# Patient Record
Sex: Male | Born: 1940 | Race: White | Hispanic: No | Marital: Married
Health system: Southern US, Community
[De-identification: ages and names within clinical notes are randomized; demographics above are authoritative.]

---

## 2008-07-21 ENCOUNTER — Emergency Department: Payer: Self-pay | Admitting: Emergency Medicine

## 2013-03-21 ENCOUNTER — Encounter: Payer: Self-pay | Admitting: Surgery

## 2013-03-21 ENCOUNTER — Ambulatory Visit: Payer: Self-pay | Admitting: Internal Medicine

## 2013-04-04 ENCOUNTER — Ambulatory Visit: Payer: Self-pay | Admitting: Vascular Surgery

## 2013-04-04 LAB — BASIC METABOLIC PANEL
Anion Gap: 5 — ABNORMAL LOW (ref 7–16)
Co2: 26 mmol/L (ref 21–32)
EGFR (African American): 46 — ABNORMAL LOW
Glucose: 269 mg/dL — ABNORMAL HIGH (ref 65–99)
Osmolality: 279 (ref 275–301)
Sodium: 132 mmol/L — ABNORMAL LOW (ref 136–145)

## 2013-04-12 ENCOUNTER — Inpatient Hospital Stay: Payer: Self-pay | Admitting: Specialist

## 2013-04-12 LAB — URINALYSIS, COMPLETE
Bilirubin,UR: NEGATIVE
Glucose,UR: >=500 mg/dL (ref 0–75)
Ketone: NEGATIVE
LEUKOCYTE ESTERASE: NEGATIVE
Nitrite: NEGATIVE
Ph: 5 (ref 4.5–8.0)
RBC,UR: 4 /HPF (ref 0–5)
Specific Gravity: 1.021 (ref 1.003–1.030)
WBC UR: 3 /HPF (ref 0–5)

## 2013-04-12 LAB — COMPREHENSIVE METABOLIC PANEL
ALK PHOS: 201 U/L — AB
Albumin: 2.4 g/dL — ABNORMAL LOW (ref 3.4–5.0)
Anion Gap: 6 — ABNORMAL LOW (ref 7–16)
BUN: 23 mg/dL — AB (ref 7–18)
Bilirubin,Total: 0.9 mg/dL (ref 0.2–1.0)
CALCIUM: 10.4 mg/dL — AB (ref 8.5–10.1)
CHLORIDE: 98 mmol/L (ref 98–107)
CO2: 26 mmol/L (ref 21–32)
Creatinine: 1.72 mg/dL — ABNORMAL HIGH (ref 0.60–1.30)
EGFR (African American): 46 — ABNORMAL LOW
EGFR (Non-African Amer.): 39 — ABNORMAL LOW
Glucose: 283 mg/dL — ABNORMAL HIGH (ref 65–99)
Osmolality: 275 (ref 275–301)
Potassium: 5.2 mmol/L — ABNORMAL HIGH (ref 3.5–5.1)
SGOT(AST): 71 U/L — ABNORMAL HIGH (ref 15–37)
SGPT (ALT): 75 U/L (ref 12–78)
Sodium: 130 mmol/L — ABNORMAL LOW (ref 136–145)
Total Protein: 8.5 g/dL — ABNORMAL HIGH (ref 6.4–8.2)

## 2013-04-12 LAB — CBC
HCT: 37.9 % — AB (ref 40.0–52.0)
HGB: 12.5 g/dL — ABNORMAL LOW (ref 13.0–18.0)
MCH: 29.4 pg (ref 26.0–34.0)
MCHC: 33.1 g/dL (ref 32.0–36.0)
MCV: 89 fL (ref 80–100)
Platelet: 414 10*3/uL (ref 150–440)
RBC: 4.27 10*6/uL — ABNORMAL LOW (ref 4.40–5.90)
RDW: 13.4 % (ref 11.5–14.5)
WBC: 29.3 10*3/uL — AB (ref 3.8–10.6)

## 2013-04-12 LAB — LIPASE, BLOOD: Lipase: 48 U/L — ABNORMAL LOW (ref 73–393)

## 2013-04-13 LAB — CBC WITH DIFFERENTIAL/PLATELET
BASOS ABS: 0 10*3/uL (ref 0.0–0.1)
Basophil %: 0.2 %
EOS ABS: 0 10*3/uL (ref 0.0–0.7)
Eosinophil %: 0.1 %
HCT: 30.5 % — ABNORMAL LOW (ref 40.0–52.0)
HGB: 10.2 g/dL — ABNORMAL LOW (ref 13.0–18.0)
Lymphocyte #: 1.2 10*3/uL (ref 1.0–3.6)
Lymphocyte %: 5.5 %
MCH: 29.4 pg (ref 26.0–34.0)
MCHC: 33.6 g/dL (ref 32.0–36.0)
MCV: 88 fL (ref 80–100)
Monocyte #: 1.4 x10 3/mm — ABNORMAL HIGH (ref 0.2–1.0)
Monocyte %: 6.3 %
NEUTROS PCT: 87.9 %
Neutrophil #: 19.6 10*3/uL — ABNORMAL HIGH (ref 1.4–6.5)
Platelet: 356 10*3/uL (ref 150–440)
RBC: 3.47 10*6/uL — ABNORMAL LOW (ref 4.40–5.90)
RDW: 13.7 % (ref 11.5–14.5)
WBC: 22.3 10*3/uL — AB (ref 3.8–10.6)

## 2013-04-13 LAB — BASIC METABOLIC PANEL
ANION GAP: 7 (ref 7–16)
BUN: 18 mg/dL (ref 7–18)
CALCIUM: 8.8 mg/dL (ref 8.5–10.1)
CHLORIDE: 104 mmol/L (ref 98–107)
CREATININE: 1.45 mg/dL — AB (ref 0.60–1.30)
Co2: 26 mmol/L (ref 21–32)
EGFR (Non-African Amer.): 48 — ABNORMAL LOW
GFR CALC AF AMER: 56 — AB
Glucose: 192 mg/dL — ABNORMAL HIGH (ref 65–99)
OSMOLALITY: 281 (ref 275–301)
Potassium: 4.2 mmol/L (ref 3.5–5.1)
Sodium: 137 mmol/L (ref 136–145)

## 2013-04-13 LAB — TROPONIN I

## 2013-04-13 LAB — CK: CK, Total: 28 U/L — ABNORMAL LOW (ref 35–232)

## 2013-04-14 LAB — COMPREHENSIVE METABOLIC PANEL
ALK PHOS: 207 U/L — AB
ALT: 115 U/L — AB (ref 12–78)
ANION GAP: 5 — AB (ref 7–16)
Albumin: 1.5 g/dL — ABNORMAL LOW (ref 3.4–5.0)
BILIRUBIN TOTAL: 0.7 mg/dL (ref 0.2–1.0)
BUN: 19 mg/dL — AB (ref 7–18)
CALCIUM: 8.8 mg/dL (ref 8.5–10.1)
CHLORIDE: 103 mmol/L (ref 98–107)
CREATININE: 1.32 mg/dL — AB (ref 0.60–1.30)
Co2: 25 mmol/L (ref 21–32)
EGFR (Non-African Amer.): 54 — ABNORMAL LOW
Glucose: 173 mg/dL — ABNORMAL HIGH (ref 65–99)
Osmolality: 273 (ref 275–301)
POTASSIUM: 4.2 mmol/L (ref 3.5–5.1)
SGOT(AST): 126 U/L — ABNORMAL HIGH (ref 15–37)
Sodium: 133 mmol/L — ABNORMAL LOW (ref 136–145)
TOTAL PROTEIN: 5.9 g/dL — AB (ref 6.4–8.2)

## 2013-04-14 LAB — CBC WITH DIFFERENTIAL/PLATELET
BASOS PCT: 0.2 %
Basophil #: 0 10*3/uL (ref 0.0–0.1)
EOS ABS: 0.2 10*3/uL (ref 0.0–0.7)
EOS PCT: 0.8 %
HCT: 29.3 % — AB (ref 40.0–52.0)
HGB: 9.6 g/dL — AB (ref 13.0–18.0)
Lymphocyte #: 1 10*3/uL (ref 1.0–3.6)
Lymphocyte %: 5.3 %
MCH: 29 pg (ref 26.0–34.0)
MCHC: 32.9 g/dL (ref 32.0–36.0)
MCV: 88 fL (ref 80–100)
MONO ABS: 1.4 x10 3/mm — AB (ref 0.2–1.0)
Monocyte %: 7.1 %
Neutrophil #: 17.1 10*3/uL — ABNORMAL HIGH (ref 1.4–6.5)
Neutrophil %: 86.6 %
Platelet: 347 10*3/uL (ref 150–440)
RBC: 3.32 10*6/uL — AB (ref 4.40–5.90)
RDW: 13.4 % (ref 11.5–14.5)
WBC: 19.8 10*3/uL — AB (ref 3.8–10.6)

## 2013-04-14 LAB — CK: CK, Total: 18 U/L — ABNORMAL LOW (ref 35–232)

## 2013-04-15 LAB — CBC WITH DIFFERENTIAL/PLATELET
BASOS ABS: 0 10*3/uL (ref 0.0–0.1)
Basophil %: 0.1 %
Eosinophil #: 0.3 10*3/uL (ref 0.0–0.7)
Eosinophil %: 1.9 %
HCT: 29.2 % — ABNORMAL LOW (ref 40.0–52.0)
HGB: 9.8 g/dL — ABNORMAL LOW (ref 13.0–18.0)
LYMPHS PCT: 6.8 %
Lymphocyte #: 1 10*3/uL (ref 1.0–3.6)
MCH: 29.2 pg (ref 26.0–34.0)
MCHC: 33.5 g/dL (ref 32.0–36.0)
MCV: 87 fL (ref 80–100)
Monocyte #: 1 x10 3/mm (ref 0.2–1.0)
Monocyte %: 6.9 %
Neutrophil #: 12.5 10*3/uL — ABNORMAL HIGH (ref 1.4–6.5)
Neutrophil %: 84.3 %
PLATELETS: 353 10*3/uL (ref 150–440)
RBC: 3.36 10*6/uL — ABNORMAL LOW (ref 4.40–5.90)
RDW: 13.3 % (ref 11.5–14.5)
WBC: 14.8 10*3/uL — ABNORMAL HIGH (ref 3.8–10.6)

## 2013-04-15 LAB — BASIC METABOLIC PANEL
Anion Gap: 3 — ABNORMAL LOW (ref 7–16)
BUN: 16 mg/dL (ref 7–18)
CO2: 27 mmol/L (ref 21–32)
CREATININE: 1.14 mg/dL (ref 0.60–1.30)
Calcium, Total: 8.7 mg/dL (ref 8.5–10.1)
Chloride: 103 mmol/L (ref 98–107)
EGFR (African American): >60
Glucose: 244 mg/dL — ABNORMAL HIGH (ref 65–99)
Osmolality: 276 (ref 275–301)
POTASSIUM: 4.3 mmol/L (ref 3.5–5.1)
SODIUM: 133 mmol/L — AB (ref 136–145)

## 2013-04-15 LAB — VANCOMYCIN, TROUGH: Vancomycin, Trough: 18 ug/mL (ref 10–20)

## 2013-04-16 LAB — CBC WITH DIFFERENTIAL/PLATELET
BASOS PCT: 0.4 %
Basophil #: 0.1 10*3/uL (ref 0.0–0.1)
EOS ABS: 0.3 10*3/uL (ref 0.0–0.7)
Eosinophil %: 2 %
HCT: 31.1 % — ABNORMAL LOW (ref 40.0–52.0)
HGB: 10.3 g/dL — AB (ref 13.0–18.0)
Lymphocyte #: 1.2 10*3/uL (ref 1.0–3.6)
Lymphocyte %: 7.7 %
MCH: 28.9 pg (ref 26.0–34.0)
MCHC: 33 g/dL (ref 32.0–36.0)
MCV: 88 fL (ref 80–100)
Monocyte #: 1 x10 3/mm (ref 0.2–1.0)
Monocyte %: 6.7 %
Neutrophil #: 12.8 10*3/uL — ABNORMAL HIGH (ref 1.4–6.5)
Neutrophil %: 83.2 %
Platelet: 386 10*3/uL (ref 150–440)
RBC: 3.55 10*6/uL — ABNORMAL LOW (ref 4.40–5.90)
RDW: 13.2 % (ref 11.5–14.5)
WBC: 15.4 10*3/uL — ABNORMAL HIGH (ref 3.8–10.6)

## 2013-04-17 LAB — PATHOLOGY REPORT

## 2013-04-17 LAB — CULTURE, BLOOD (SINGLE)

## 2013-04-18 LAB — CULTURE, BLOOD (SINGLE)

## 2013-04-19 LAB — CBC WITH DIFFERENTIAL/PLATELET
BASOS ABS: 0 10*3/uL (ref 0.0–0.1)
Basophil %: 0.2 %
Eosinophil #: 0.2 10*3/uL (ref 0.0–0.7)
Eosinophil %: 1.5 %
HCT: 31.9 % — ABNORMAL LOW (ref 40.0–52.0)
HGB: 10.6 g/dL — AB (ref 13.0–18.0)
LYMPHS PCT: 7.3 %
Lymphocyte #: 1 10*3/uL (ref 1.0–3.6)
MCH: 29 pg (ref 26.0–34.0)
MCHC: 33.1 g/dL (ref 32.0–36.0)
MCV: 88 fL (ref 80–100)
MONOS PCT: 6.2 %
Monocyte #: 0.9 x10 3/mm (ref 0.2–1.0)
Neutrophil #: 12.1 10*3/uL — ABNORMAL HIGH (ref 1.4–6.5)
Neutrophil %: 84.8 %
Platelet: 384 10*3/uL (ref 150–440)
RBC: 3.64 10*6/uL — ABNORMAL LOW (ref 4.40–5.90)
RDW: 13.8 % (ref 11.5–14.5)
WBC: 14.3 10*3/uL — ABNORMAL HIGH (ref 3.8–10.6)

## 2013-04-19 LAB — BASIC METABOLIC PANEL
Anion Gap: 4 — ABNORMAL LOW (ref 7–16)
BUN: 20 mg/dL — AB (ref 7–18)
Calcium, Total: 8.6 mg/dL (ref 8.5–10.1)
Chloride: 100 mmol/L (ref 98–107)
Co2: 27 mmol/L (ref 21–32)
Creatinine: 1.37 mg/dL — ABNORMAL HIGH (ref 0.60–1.30)
EGFR (Non-African Amer.): 52 — ABNORMAL LOW
GLUCOSE: 242 mg/dL — AB (ref 65–99)
OSMOLALITY: 273 (ref 275–301)
Potassium: 5.7 mmol/L — ABNORMAL HIGH (ref 3.5–5.1)
Sodium: 131 mmol/L — ABNORMAL LOW (ref 136–145)

## 2013-04-19 LAB — POTASSIUM: POTASSIUM: 4.9 mmol/L (ref 3.5–5.1)

## 2013-04-19 LAB — WOUND CULTURE

## 2013-04-20 ENCOUNTER — Encounter: Payer: Self-pay | Admitting: Surgery

## 2013-04-20 LAB — PATHOLOGY REPORT

## 2014-04-22 ENCOUNTER — Emergency Department: Payer: Self-pay | Admitting: Emergency Medicine

## 2014-04-22 LAB — URINALYSIS, COMPLETE
Bilirubin,UR: NEGATIVE
Glucose,UR: 50 mg/dL (ref 0–75)
Hyaline Cast: 12
Ketone: NEGATIVE
Nitrite: NEGATIVE
Ph: 5 (ref 4.5–8.0)
Protein: 30
RBC,UR: 6 /HPF (ref 0–5)
Specific Gravity: 1.015 (ref 1.003–1.030)
Squamous Epithelial: 2

## 2014-04-24 LAB — URINE CULTURE

## 2014-08-02 NOTE — Op Note (Signed)
PATIENT NAME:  Jeremiah Chang, Kalden K MR#:  [id number] DATE OF BIRTH:  November 08, 1940  DATE OF PROCEDURE:  04/04/2013  PREOPERATIVE DIAGNOSES:  1.  Peripheral arterial disease with gangrene, left lower extremity.  2.  Coronary artery disease.  3.  End-stage renal disease, status post renal transplant.  4.  Hyperlipidemia.   POSTOPERATIVE DIAGNOSES: 1.  Peripheral arterial disease with gangrene, left lower extremity.  2.  Coronary artery disease.  3.  End-stage renal disease, status post renal transplant.  4.  Hyperlipidemia.   PROCEDURES:  1.  Ultrasound guidance for vascular access, right femoral artery.  2.  Catheter placement into left posterior tibial artery from right femoral approach.  3.  Aortogram and selective left lower extremity angiogram.  4.  Percutaneous transluminal angioplasty of right common iliac artery with 7 mm diameter angioplasty balloon.  5.  Percutaneous transluminal angioplasty of the left distal superficial femoral artery and above-knee popliteal artery with 6 mm diameter angioplasty balloon.  6.  Percutaneous transluminal angioplasty with a 3 mm diameter angioplasty balloon to the tibioperoneal trunk and posterior tibial artery.  7.  Drug-eluting stent with a 4 mm diameter angioplasty balloon to the proximal posterior tibial artery and tibioperoneal trunk.  8.  StarClose closure device right femoral artery.   SURGEON: Annice NeedyJason S Cuyler Vandyken, M.D.   ANESTHESIA: Local with moderate conscious sedation.   ESTIMATED BLOOD LOSS: Minimal.   FLUOROSCOPY TIME: 12 minutes.  CONTRAST USTE: 90 mL of contrast were used.   INDICATION FOR PROCEDURE: A 74 year old white male, who saw me this week with a gangrenous left foot. He is brought in for hope for revascularization for limb salvage. The risks and benefits are discussed. Informed consent is obtained.   DESCRIPTION OF PROCEDURE: The patient was brought to the vascular and radiology suite. Ultrasound was used to visualize a patent right  femoral artery. It was accessed under direct ultrasound guidance without difficulty with a Seldinger needle. A J-wire and 5-French sheath were placed. Pigtail catheter was placed in the aorta at the L1-L2 level and AP aortogram was performed. This showed an 80% stenosis in the right common iliac artery about 1 to 2 cm beyond its origin. The aorta and left iliac artery were patent, although all vessels were highly calcific. I then crossed the aortic bifurcation and advanced to the left femoral head and selective left lower extremity angiogram was performed. This demonstrated a very calcific common femoral artery, profunda femoris artery. They were not stenotic. Superficial femoral artery was mildly stenotic proximally with no flow-limiting stenosis of the distal segments where there was ratty disease in the distal SFA and above-knee popliteal artery with one area with a large calcific lesion with about a 90% stenosis. I then continued the angiography distally. The tibioperoneal trunk was occluded. The peroneal artery reconstituted as did the posterior tibial artery distally. The patient was systemically heparinized.  A 6-French Ansell sheath was placed over a Terumo advantage wire. The right common iliac lesion was treated with a 7 mm diameter angioplasty balloon with good angiographic completion result. This allowed us to easily traverse with the sheath and catheter and work on the left lower extremity. The Terumo advantage wire and a V18 wire were used as were Kumpe and CXI catheter. I was able to cross the stenoses in the SFA and popliteal artery as well as the occlusion in the tibioperoneal trunk and posterior tibial artery and confirm intraluminal flow in the foot and the posterior tibial artery. I then replaced a 0.18  wire, treated with a 3 mm diameter angioplasty balloon from the ankle up to the distal popliteal artery. Multiple areas of waists were taken, which resolved with angioplasty. Attempts at probing  into the peroneal artery were not successful and I elected to terminate this endeavor as we now had a channel to the foot. There was still residual stenosis within the tibioperoneal trunk and proximal posterior tibial artery and so I used a 4 mm diameter Lutonix drug-eluting angioplasty balloon with a good angiographic completion result and significantly improved flow.  I then turned my attention to the distal SFA and popliteal lesion. A 6 mm diameter angioplasty balloon was inflated in this location with good angiographic completion result and no greater than 20% residual stenosis. At this point, I elected to terminate the procedure. The sheath was pulled back to the ipsilateral external iliac artery and oblique arteriogram was performed. A StarClose closure device was deployed in the usual fashion with excellent hemostatic result. The patient tolerated the procedure well and was taken to the recovery room in stable condition.   ____________________________ Annice Needy, MD jsd:aw D: 04/04/2013 12:27:00 ET T: 04/04/2013 12:52:24 ET JOB#: [id number]  cc: Annice Needy, MD, <Dictator> Annice Needy MD ELECTRONICALLY SIGNED 04/09/2013 8:25

## 2014-08-03 NOTE — Op Note (Signed)
PATIENT NAME:  Jeremiah EdisonGOOCH, Beecher K MR#:  [id number] DATE OF BIRTH:  1940/12/05  DATE OF PROCEDURE:  04/18/2013  PREOPERATIVE DIAGNOSES: Gangrene of left foot.   POSTOPERATIVE DIAGNOSIS:  Gangrene of left foot.   PROCEDURE PERFORMED;  Left below-knee amputation.   SURGEON: Levora DredgeGregory Nora Sabey, M.D.   ANESTHESIA: General by endotracheal intubation.   FLUIDS: Per anesthesia record.   ESTIMATED BLOOD LOSS: 75 mL.   SPECIMEN: Distal left lower extremity to pathology for permanent section.   INDICATIONS: Mr. [name] is a 74 year old gentleman who presented to the ER with gas gangrene of the foot. He has undergone debridement per Dr. Orland Jarredroxler but has been left with a situation that is nonsalvageable. His weight-bearing surface has been mostly destroyed and therefore it is recommended he proceed with below the knee amputation so that with a prosthesis, he will maintain his ambulatory status. Risks and benefits were reviewed. All questions answered. The patient agrees to proceed.   DESCRIPTION OF PROCEDURE: The patient is taken to the operating room and placed in the supine position. After adequate general anesthesia is induced and appropriate invasive monitors are placed, he is positioned supine and his left leg is prepped and draped in a circumferential fashion. Appropriate timeout is called.   Working approximately one handbreadth below the tibial tuberosity, umbilical tape is used to obtain a circumferential a length measurement and then a posterior flap reconstruction is diagrammed on the leg using a surgical marker.   A linear incision is then created along these lines and carried down through the skin.  Saphenous vein and several other larger veins are ligated with 3-0 silk ties. The fascia was then incised sharply. Muscles are then transected with Bovie cautery. Periosteum of the tibia and the fibula is then elevated using periosteal elevator. The posterior tibial and anterior tibial neurovascular  bundles were identified and ligated and divided between 0 Vicryl ties.   Using a Gigli saw, the tibia is transected using bone shears. The fibula is transected approximately 2 cm above the tibia level. Amputation knife is then used to transect the posterior muscle bellies. Bleeding is then controlled with hemostats and then ligated with 0 Vicryl. The nerve is identified and ligated with 0 Vicryl approximately 3 cm above the level of the amputation and allowed to retract up into the wound.   A rasp used to smooth the surface of the tibia and the flap is washed with a liter of sterile saline. The fascia is then reapproximated using interrupted 0 Vicryl, and the skin is closed with staples. Xeroform and a sterile dressing is applied. The patient tolerated the procedure well and there were no immediate complications. Sponge and needle counts were correct and he was taken to the recovery area in stable condition.    ____________________________ Renford DillsGregory G. Paylin Hailu, MD ggs:dp D: 04/18/2013 14:20:08 ET T: 04/18/2013 16:31:08 ET JOB#: [id number]  cc: Renford DillsGregory G. Marializ Ferrebee, MD, <Dictator> Renford DillsGREGORY G Ranya Fiddler MD ELECTRONICALLY SIGNED 04/26/2013 9:47

## 2014-08-03 NOTE — H&P (Signed)
PATIENT NAME:  Jeremiah Chang, Jeremiah Chang MR#:  [id number] DATE OF BIRTH:  1941/03/11  DATE OF ADMISSION:  04/12/2013  [facility]  [facility]   VASCULAR SURGEON:  Dr. Wyn Quakerew.  CHIEF COMPLAINT:  Confusion.   HISTORY OF PRESENT ILLNESS:  This is a 74 year old man who since October 1 has been dealing with a small ulcer on the bottom of his foot. Then it just expanded. He has had a black first toe for the past 4 to 6 weeks and it expanded to the second toe after that. He saw Dr. Wyn Quakerew on December 23 and had an angiogram on the 24th with angioplasties to try to get better blood supply down to the foot. He had an appointment tomorrow with Dr. Wyn Quakerew as an outpatient, but developed confusion today, could not get his thoughts together and urinated himself. In the ER, he was found to be in clinical sepsis with leukocytosis, tachycardia and gangrene on his feet. Hospitalist services were contacted for further evaluation.   PAST MEDICAL HISTORY:  Hypertension, coronary artery disease, hyperlipidemia, CVA, kidney transplant, diabetes, peripheral vascular disease, clinically blind.   PAST SURGICAL HISTORY:  Angioplasty, CABG, kidney transplant, gum surgeries.   ALLERGIES:  TOPROL-XL.   MEDICATIONS:  Allopurinol 100 mg daily, aspirin 81 mg daily, vitamin D 1000 international units daily, glipizide 10 mg twice a day, Lantus 45 units at bedtime, lisinopril 5 mg 1/2 tablet daily, metoprolol 25 mg twice a day, omeprazole 10 mg daily, Plavix 75 mg daily, simvastatin 80 mg 1/2 tablet at bedtime, tacrolimus 2 tablets in the morning and 1 tablet in the evening of 0.5 mg.   SOCIAL HISTORY:  Quit smoking years ago, rare alcohol. No drug use. Used to do Merchandiser, retailsheet rock construction in the past. Lives with his wife.   FAMILY HISTORY:  Father died in his 4580s of Alzheimer's disease. Mother died at age 72 of a heart attack.   REVIEW OF SYSTEMS.  CONSTITUTIONAL:  Positive for chills. Positive for sweats. No fever. Positive for  weight loss, 30 pounds. Positive for fatigue.  EYES:  Legally blind.  EARS, NOSE, MOUTH AND THROAT:  No hearing loss. No sore throat. No difficulty swallowing.  CARDIOVASCULAR:  No chest pain. No palpitations.  RESPIRATORY:  No shortness of breath. No coughing. No sputum. No hemoptysis.  GASTROINTESTINAL:  No nausea. No vomiting. No abdominal pain. No diarrhea. No constipation. No bright-red blood per rectum. No melena.  GENITOURINARY:  No burning on urination. No hematuria.  MUSCULOSKELETAL:  Positive for foot pain, 10/10 in intensity.  INTEGUMENTARY:  Positive for gangrene. HEMATOLOGIC AND LYMPHATIC:  No anemia.  ENDOCRINE:  No thyroid issues.   PHYSICAL EXAMINATION:  VITAL SIGNS:  On presentation included a temperature of 97.7, pulse 114, respirations 28, blood pressure 95/55, pulse ox good on room air.  GENERAL:  No respiratory distress.  EYES:  Conjunctivae and lids normal. Pupils equal, round, and reactive to light. Extraocular muscles intact. Nasal mucosa:  No erythema.  THROAT:  No erythema, no exudate seen. LIPS AND GUMS:  No lesions.  NECK:  No JVD. No bruits. No lymphadenopathy. No thyromegaly. No thyroid nodules palpated.  LUNGS:  Clear to auscultation. No use of accessory muscles to breathe. No rhonchi, rales, or wheeze heard.  CARDIOVASCULAR:  S1, S2 normal. No gallops or rubs heard, 2/6 systolic ejection murmur. Carotid upstroke 2+ bilaterally. No bruits.  EXTREMITIES:  Dorsalis pedis pulses difficult to palpate. Trace edema of the lower extremity.  ABDOMEN:  Soft, nontender. No organosplenomegaly. Normoactive bowel sounds. No masses felt.  LYMPHATIC:  No lymph nodes in the neck.  MUSCULOSKELETAL:  No clubbing. Trace edema. No cyanosis.  SKIN:  Positive erythema top of left foot, gangrene spots seen on the bottom of the left foot, the entire first toe gangrenous, the entire second toe gangrenous, third toe is bluish, fourth toe slight bluish also.   PSYCHIATRIC:  Oriented to  person, place and time.  NEUROLOGIC:  Cranial nerves II through XII grossly intact. Deep tendon reflexes 1+ bilateral lower extremity.   LABORATORY AND RADIOLOGICAL DATA:  Left foot showed diffuse soft tissue emphysema, consistent with infection with gas forming organism. Urinalysis 1+ blood, 100 mg/dL of protein. CT scan of the head:  No acute intracranial pathology. Glucose 283, BUN 23, creatinine 1.72, sodium 130, potassium 5.2, chloride 98, CO2 26, calcium 10.4. Liver function tests:  Alkaline phosphatase up at 201, AST up at 71, albumin low at 2.4. White blood cell count 29.3, H and H 12.5 and 37.9, lipase 48.   EKG:  Sinus arrhythmia, first-degree AV block, flattening of T-waves laterally, Q-waves inferiorly.   ASSESSMENT AND PLAN:  1.  Clinical sepsis with gas gangrene, leukocytosis and tachycardia with confusion. We will give IV vancomycin, meropenem and clindamycin. ER physician spoke with Vascular Surgery already. We will call them back with the results of the x-ray showing gas gangrene. Likely will need a below-knee amputation. Surgery must be done to prevent overwhelming sepsis and death. We will hold aspirin and Plavix at this point. The patient is a FULL CODE.  2.  A history of hypertension. Blood pressure on the lower side. We will give metoprolol, if able to do so.  3.  A history of coronary artery disease. No chest pain. Continue metoprolol, hold aspirin and Plavix.  4.  A history of cerebrovascular accident. Hold aspirin and Plavix.  5.  Chronic kidney disease, stage III, status post kidney transplantation. Continue tacrolimus. We will give IV fluid hydration.  6.  Hyperkalemia. We will monitor on tele, give a dose of Kayexalate.  7.  Diabetes. We will put on sliding scale and give a smaller dose of Lantus insulin.  8.  Peripheral vascular disease. Likely will need below-knee amputation with gangrene.  9.  Malnutrition.  10.  Increased liver function tests, likely secondary to  infection.   CODE STATUS:  The patient is a FULL CODE.   TIME SPENT ON ADMISSION:  60 minutes.   ____________________________ Herschell Dimes. Renae Gloss, MD rjw:jm D: 04/12/2013 15:09:22 ET T: 04/12/2013 16:06:31 ET JOB#: [id number]  cc: Herschell Dimes. Renae Gloss, MD, <Dictator> [facility] Annice Needy, MD Salley Scarlet MD ELECTRONICALLY SIGNED 04/13/2013 16:50

## 2014-08-03 NOTE — Consult Note (Signed)
PATIENT NAME:  Jeremiah Chang, Jeremiah Chang MR#:  [id number] DATE OF BIRTH:  06-Mar-1941  DATE OF CONSULTATION:  04/13/2013  CONSULTING PHYSICIAN:  Rhona RaiderMatthew G. Konnie Noffsinger, DPM  He is a 74 year old male, presented to the ER, I think on Christmas Eve, had angioplasty and stenting done by Dr. Wyn Quakerew. His gangrene, unfortunately, progressed on his toes and he has had continued worsening of symptoms including swelling, redness to his left foot, gangrenous changes to really all of his toes with a large gangrenous swath on the plantar aspect.   X-rays taken here in the ER last night showed gaseous changes developing in the plantar aspect of his midfoot. This all indicates the need to get this opened up and drained and try to get all of the tissue reduced in the region.   PAST MEDICAL HISTORY: Includes hypertension, coronary artery disease, hyperlipidemia, CVA, kidney transplant, diabetes, PVD. He is clinically blind at this point.   PAST SURGICAL HISTORY: Includes angioplasty, CABG, kidney transplant, gum surgeries.   [facility]   ALLERGIES: Include TOPROL-XL.   CURRENT MEDICATIONS:  Include allopurinol 100 mg daily, aspirin 81 mg daily, glipizide 10 mg twice a day, Lantus 45 units at bedtime, lisinopril 5 mg 1/2 tablet daily, metoprolol 25 mg twice a day, omeprazole 10 mg daily, Plavix 75 mg daily, simvastatin 80 mg half tab at bedtime, tacrolimus 2 tablets in the morning and 1 tablet in the evening.   SOCIAL HISTORY: Remote smoker. Rare EtOH. Denies drug use. 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 He lives with his wife.   Lower extremity exam shows nonpalpable pulses of the left foot. There is gangrene to all of his toes on the left foot, worse on the first, second and third toes. Those toes are cyanotic and discolored. He has a large swath of dead tissue on the plantar centrolateral aspect of his foot, along with some redness, erythema and swelling to the region. X-rays have shown gas in the tissues. All  things considered, this needs to be opened up as soon as possible to allow for drainage, to get the infection under control, and then we can start to utilize a likely plan for a BKA for him. I do not think he is going to get closure of these areas. Mostly, we are going to need to go ahead and proceed with this in order to prevent sepsis and potential life threatening infection to the patient.  His family seems to understand this. Dr. Gilda CreaseSchnier explained it to him, as well as myself, and we are going to go ahead and set him up for surgery today to get this taken care of.     ____________________________ Rhona RaiderMatthew G. Feliz Herard, DPM mgt:dmm D: 04/13/2013 10:08:00 ET T: 04/13/2013 10:30:12 ET JOB#: [id number]  cc: Rhona RaiderMatthew G. Ethaniel Garfield, DPM, <Dictator> Epimenio SarinMATTHEW G Bentlee Benningfield MD ELECTRONICALLY SIGNED 04/24/2013 16:11

## 2014-08-03 NOTE — Discharge Summary (Signed)
PATIENT NAME:  Jeremiah Chang, Jamerius K MR#:  [id number] DATE OF BIRTH:  01-27-1941  DATE OF ADMISSION:  04/12/2013 DATE OF DISCHARGE:  04/19/2013  For detailed note, please take a look at the history and physical done on admission by Dr. Alford Highlandichard Wieting.   DIAGNOSES AT DISCHARGE:  Are as follows:  Sepsis secondary to left gangrenous toe, status post left-sided below-knee amputation; left-sided below-knee amputation, severe peripheral vascular disease, hypertension, hyperlipidemia, history of renal transplant. Gastroesophageal reflux disease.   The patient is being discharged on a low-sodium, low-fat, carb-controlled, American Diabetes Association diet.   ACTIVITY: As tolerated.   [facility] in the next 1 to 2 weeks. He also will follow up with Dr. Levora DredgeGregory Schnier from vascular surgery in the next 1 week or so.   DISCHARGE MEDICATIONS:  Are as follows:  Tacrolimus 0.5 mg daily at bedtime, allopurinol 100 mg daily, lisinopril 5 mg 1/2 tab daily, simvastatin 80 mg 1/2 tab at bedtime, metoprolol tartrate 25 mg b.i.d., cholecalciferol 1000 units daily, aspirin 81 mg daily, glipizide 10 mg b.i.d., omeprazole 10 mg daily, Lantus 45 units at bedtime, Plavix 75 mg daily, tacrolimus 0.5 mg 2 caps daily in the morning, amoxicillin 500 mg 1 cap q.8 hours x 7 days, Levaquin 750 mg daily x 7 days,  hydrocodone with Tylenol 5/325, 1 tab q.6 hours as needed for pain.   Consultants during the hospital course are Dr. Recardo EvangelistMatthew Troxler from podiatry, Dr. Mady HaagensenMunsoor Lateef and Mosetta PigeonHarmeet Singh from nephrology.  Dr. Festus BarrenJason Dew and Dr. Gilda CreaseSchnier from vascular surgery.   PERTINENT STUDIES DONE DURING THE HOSPITAL COURSE: Are as follows: CT of the head done without contrast on admission showing no acute intracranial pathology. An x-ray of the left foot showing diffuse soft tissue emphysema consistent with infection and with a gas-forming organism. No radiographic evidence of osteomyelitis. The patient's wound cultures and  blood cultures to be positive for Enterobacter cloacae and Enterococcus faecalis.   HOSPITAL COURSE: This is a 74 year old male with medical problems as mentioned above, presented to the hospital on 04/12/2013, due to altered mental status and noted to be septic secondary to a gangrenous left toe.  1.  Sepsis. This was likely secondary to the gas gangrene on the left toe. The patient was empirically first started on IV antibiotics with vancomycin, meropenem and clindamycin. The patient was seen by podiatry. The patient underwent amputation of the gangrenous toe done by Dr. Orland Jarredroxler. The patient had significant PVD in that leg and has had an angiogram done by Dr. Gilda CreaseSchnier in the past. After being seen by vascular, it was thought that the patient would benefit from below-the-knee amputation, which was done on 04/18/2013. The patient's sepsis was secondary to the gas gangrene. His wound cultures grew Enterobacter and Enterococcus faecalis. Initially, as mentioned, he was on broad-spectrum IV antibiotics. Eventually, those were narrowed down to oral antibiotics with Levaquin and amoxicillin. He is currently afebrile, hemodynamically stable, and therefore is being discharged home.  2. 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 This was likely the cause of the patient's sepsis. The patient was status post partial amputation of the left toe done by Dr. Orland Jarredroxler and eventually underwent a left below-the-knee amputation Dr. Gilda CreaseSchnier. Postoperatively, the patient is currently doing well with minimal pain. He is hemodynamically stable and afebrile. He is being discharged home on oral antibiotics with Levaquin and amoxicillin to cover for the Enterobacter and Enterococcus faecalis, and he will follow up with Dr. Gilda CreaseSchnier as an outpatient  for his dressing change after his below-the-knee amputation.  3.  Acute on chronic renal failure. This was likely secondary to acute tubular necrosis from his sepsis. The patient was hydrated  with IV fluids. His creatinine is now currently at baseline. He does have a history of renal transplant, therefore nephrology consult was obtained. They agreed with the management. The patient is on tacrolimus as an immunosuppressant. The levels of tacrolimus were checked, which were within normal range. He will continue followup with his outpatient nephrologist.  4.  Hypertension. The patient remained hemodynamically stable on his metoprolol, and we will continue that.  5.  Hyperlipidemia. The patient was maintained on simvastatin. We will resume that.  6.  Diabetes. The patient's blood sugars remained stable. He was maintained on his Levemir and sliding scale insulin, but he will resume his oral glipizide and Lantus upon discharge.   The patient is a full code.   He was seen by physical therapy and thought he would benefit from home health services, which is being arranged for him prior to discharge. The family and the patient are in agreement with this plan.  The patient is therefore being discharged home.  Time spent on discharge is 45 minutes.   ____________________________ Rolly Pancake. Cherlynn Kaiser, MD vjs:dmm D: 04/19/2013 15:39:18 ET T: 04/19/2013 22:08:22 ET JOB#: [id number]  cc: Rolly Pancake. Cherlynn Kaiser, MD, <Dictator> Renford Dills, MD [place] Siren MD ELECTRONICALLY SIGNED 05/09/2013 11:22

## 2014-08-03 NOTE — Consult Note (Signed)
Brief Consult Note: Diagnosis: gangrene with gas to left foot and toesgas to midfoot.   Patient was seen by consultant.   Consult note dictated.   Recommend to proceed with surgery or procedure.   Recommend further assessment or treatment.   Orders entered.   Discussed with Attending MD.   Comments: Pt needs I&D of left foot to eliminate gas gangrrene and stabilize infection.  Will need BKA from Dr. Lorretta HarpSchneir in a few days.  Electronic Signatures: Epimenio Sarinroxler, Magalie Almon G (MD)  (Signed 02-Jan-15 10:04)  Authored: Brief Consult Note   Last Updated: 02-Jan-15 10:04 by Epimenio Sarinroxler, Lilia Letterman G (MD)

## 2014-08-03 NOTE — Op Note (Signed)
PATIENT NAME:  Jeremiah Chang, Jeremiah Chang MR#:  [id number] DATE OF BIRTH:  01/04/1941  DATE OF PROCEDURE:  04/13/2013  PREOPERATIVE DIAGNOSIS: Gas gangrene, left forefoot.   POSTOPERATIVE DIAGNOSIS:  Gas gangrene, left forefoot.   PROCEDURE: Amputation Chopart level left forefoot.   SURGEON: Epimenio SarinMatthew G Tyler Robidoux, DPM.   ASSISTANT: None.   HISTORY OF PRESENT ILLNESS: The patient has had a worsening infection and gangrenous changes to the left foot. He had angioplasty done before Christmas but foot failed to improve. He came back to the Emergency Room and has gas gangrene in the plantar aspect of the foot running all the way back to the rear foot area along with gangrene to all of his toes.  This is spreading proximally on the plantar surface with the tissue death all the way to the posterior heel.   ANESTHESIA:  General.   ANESTHESIOLOGIST: Dr. Dimple Caseyice.   ESTIMATED BLOOD LOSS: Approximately 50 to 75 mL.   OPERATIVE REPORT:  The patient was brought to the OR and placed in  the OR table in the supine position. At this point after general anesthesia was achieved, the patient was then prepped and draped in the usual sterile manner. At this point an elliptical incision was made along the dorsal aspect of the foot, carried proximally on the medial and lateral sides. There was a large swath of necrotic tissue plantarly that was about 8 cm long and 4 to 5 cm wide. This was incised during the amputation process as well. The incision was carried down to bone at this point and the tarsometatarsal joints were identified and resected and the bones were disarticulated at this point. The plantar soft tissue (Dictation Anomaly) <<necrotic MISSING TEXT>>  in this area was removed and gotten ready to send to pathology. At this point, it was noted there was some tracking up the tarsal canal with tissue damage to the long flexor tendons and also the posterior tibial nerve looked like it was infected at this point also. This was  explored. An incision was made proximally to open that area up.  The nerve was encountered and resected up to the more proximal aspect.  The tendons were also resected to the proximal aspect. Also, the posterior tibial artery had been resected during the amputation process. This had just a minimal pulsatile flow to it. This was clamped and tied and had shut off of flow to this area nicely. The rest of it was just some capillary bleeding. After copious irrigation, at this point, I felt there was some purulence emitting between the cuneiform and navicular area. I elected to go ahead and resect the cuneiform and the cuboid as well making a Chopart level amputation. This was completely irrigated at this point and at this time, a wound VAC using Silver GranuFoam was placed across the area set at 75 mm continuous and he seemed to do pretty well with that. It was functional once we got all the applications in place. He tolerated the procedure and anesthesia well and left the OR for the recovery room with vital signs stable and neurovascular status intact to the remaining portions of his foot. There was a culture sent off of deep tissue. Everything else was sent to pathology for evaluation. The patient tolerated this, as noted, the procedure and anesthesia well.   ____________________________ Rhona RaiderMatthew G. Nakota Ackert, DPM mgt:dp D: 04/13/2013 12:04:00 ET T: 04/13/2013 12:16:27 ET JOB#: [id number]  cc: Rhona RaiderMatthew G. Ignatz Deis, DPM, <Dictator> Epimenio SarinMATTHEW G Valeriano Bain MD ELECTRONICALLY SIGNED 04/24/2013  16:12 

## 2015-03-05 IMAGING — CT CT HEAD WITHOUT CONTRAST
1 series · 16 of 30 positions shown, 20 images · non-contrast
Comparison: None.

CLINICAL DATA: Altered mental status

EXAM:
CT HEAD WITHOUT CONTRAST
TECHNIQUE: Contiguous axial images were obtained from the base of the skull
through the vertex without intravenous contrast.

[Series 2: head wo · axial · 0.41mm/px · z∈[-55,+80]mm · 16 of 34 slices shown, 20 images]
[im 2/34  brain]
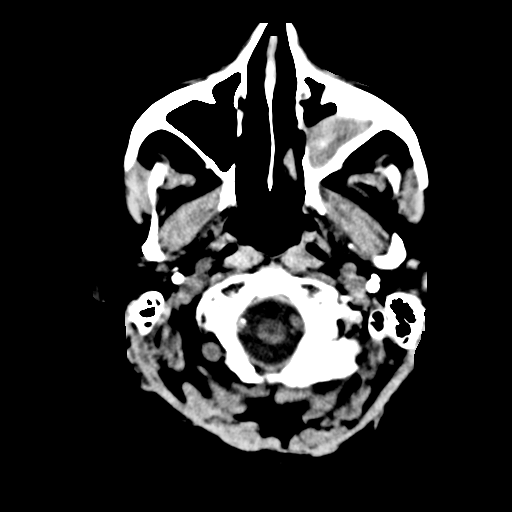
[im 2/34  bone]
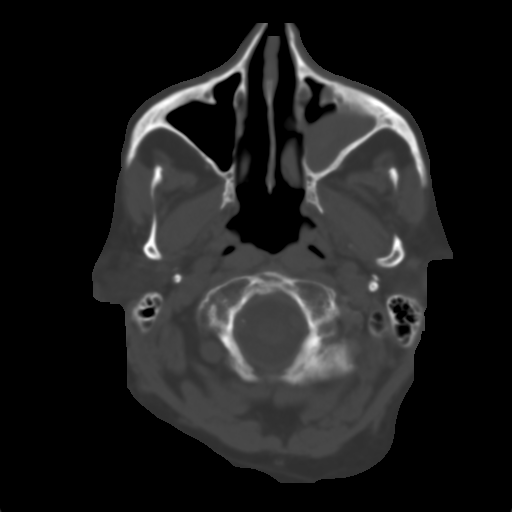
[im 4/34  brain]
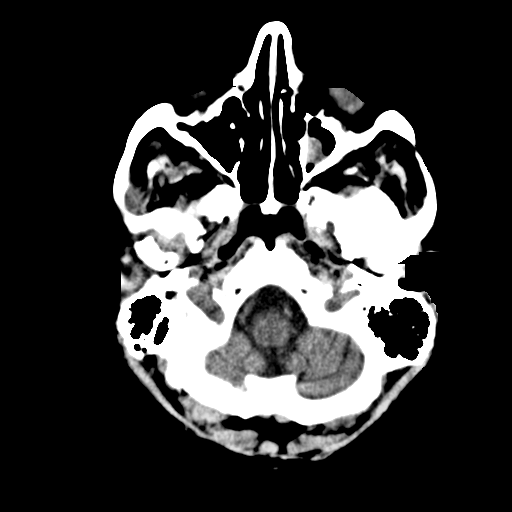
[im 6/34  brain]
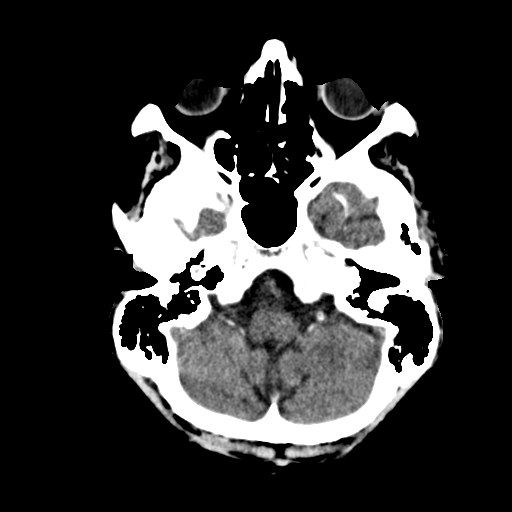
[im 8/34  brain]
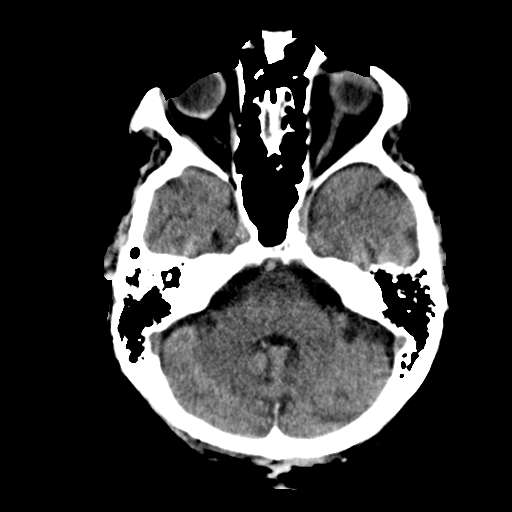
[im 10/34  brain]
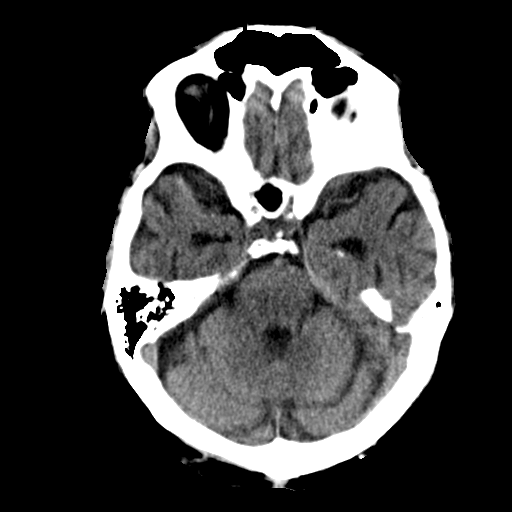
[im 10/34  bone]
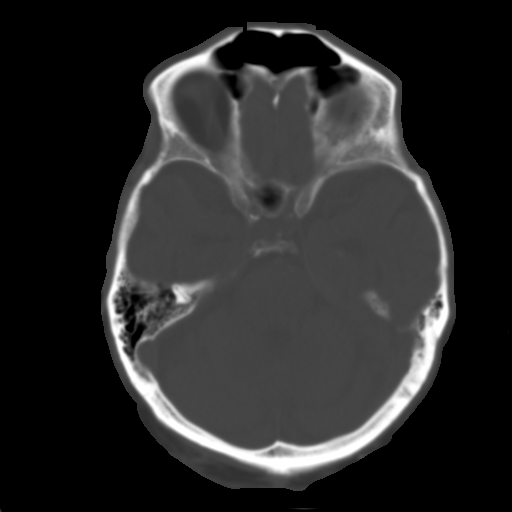
[im 12/34  brain]
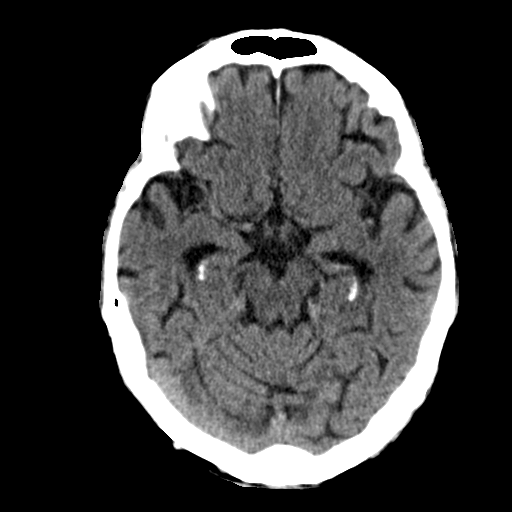
[im 14/34  brain]
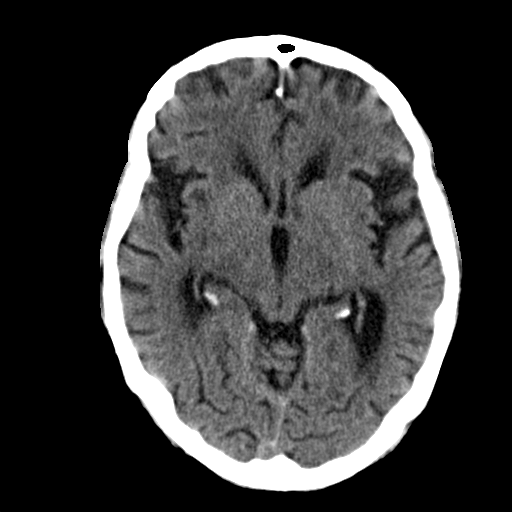
[im 16/34  brain]
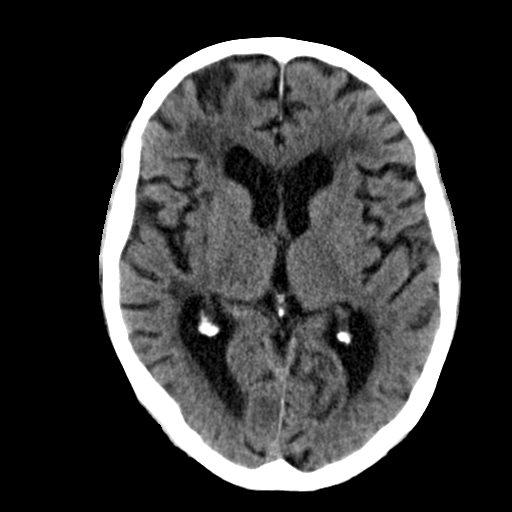
[im 18/34  brain]
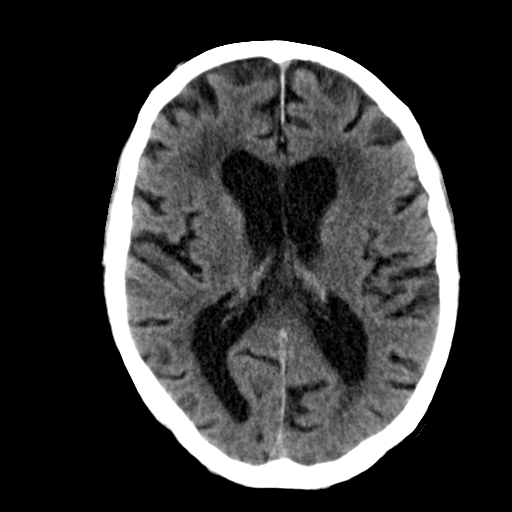
[im 18/34  bone]
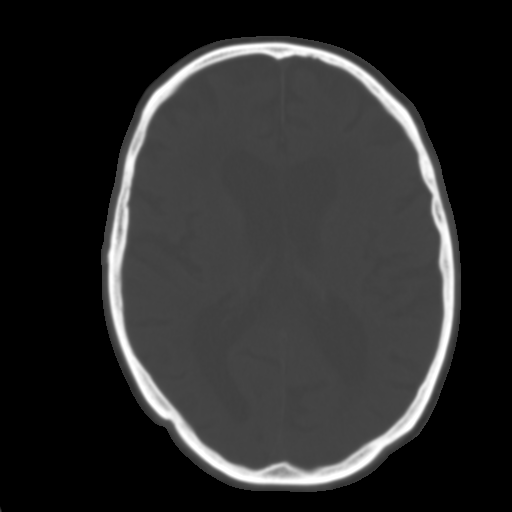
[im 20/34  brain]
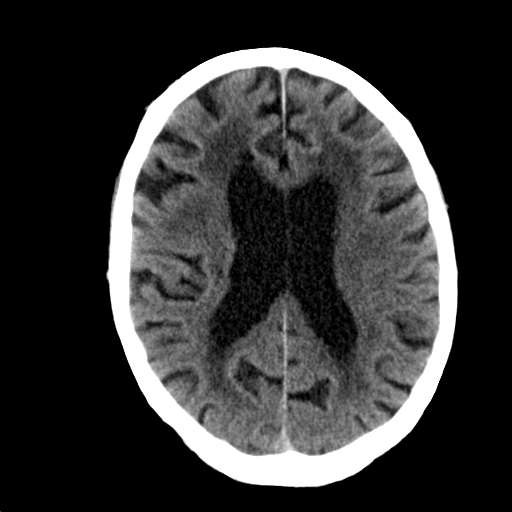
[im 22/34  brain]
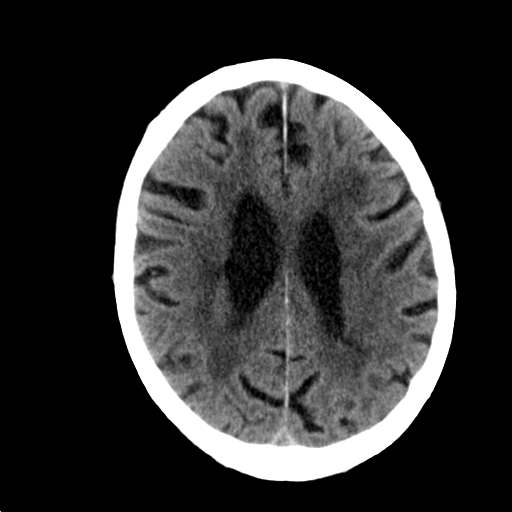
[im 24/34  brain]
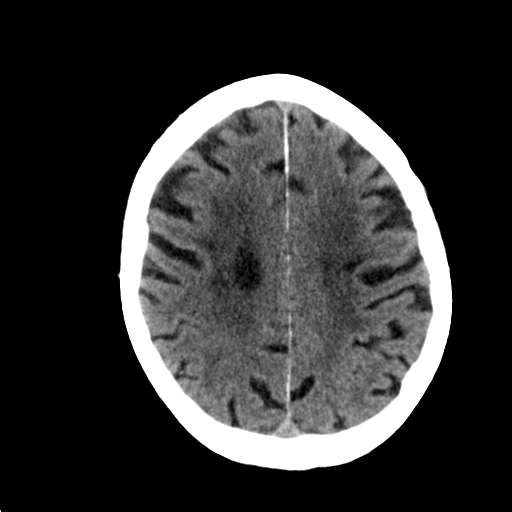
[im 26/34  brain]
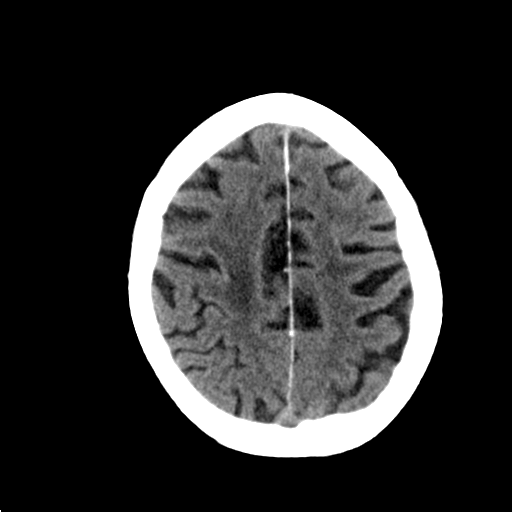
[im 26/34  bone]
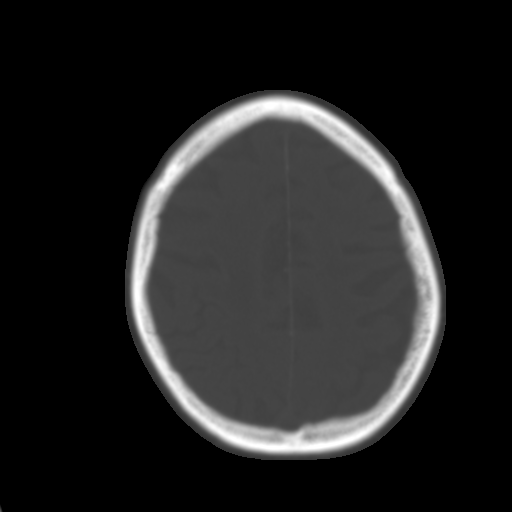
[im 28/34  brain]
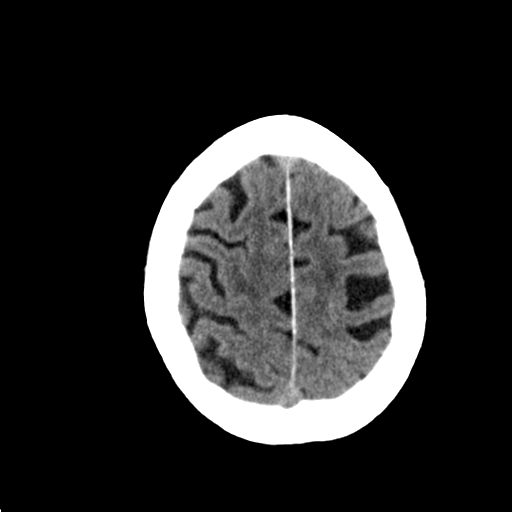
[im 30/34  brain]
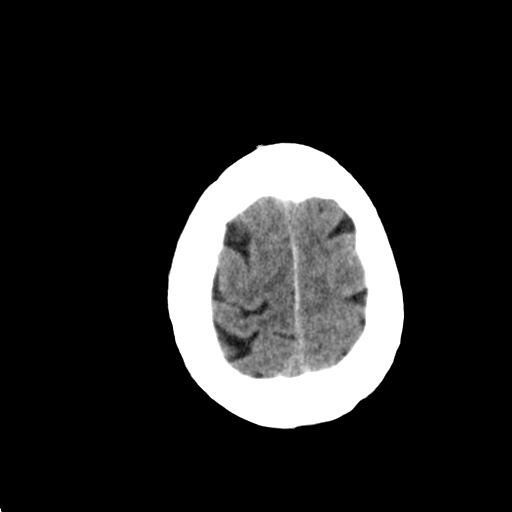
[im 32/34  brain]
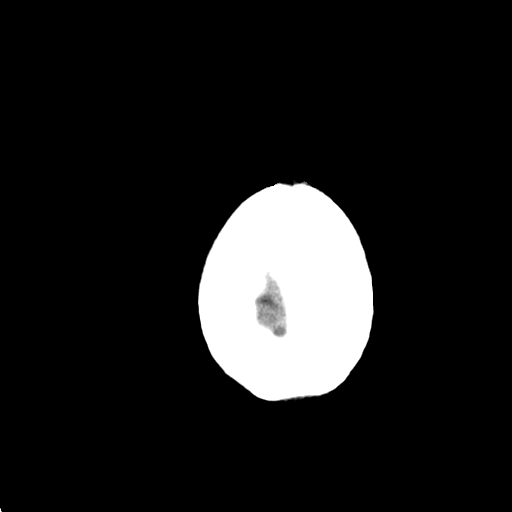

[16 of 30 positions shown; findings below may reference images not displayed]

FINDINGS: There is no evidence of mass effect, midline shift or extra-axial
fluid collections. There is no evidence of a space-occupying lesion
or intracranial hemorrhage. There is no evidence of a cortical-based
area of acute infarction. There is generalized cerebral atrophy.
There is periventricular white matter low attenuation likely
secondary to microangiopathy.

The ventricles and sulci are appropriate for the patient's age. The
basal cisterns are patent.

Visualized portions of the orbits are unremarkable. There is near
complete opacification of the left maxillary sinus. Vascular
intracranial atherosclerotic disease is noted.

The osseous structures are unremarkable.
IMPRESSION: No acute intracranial pathology.

## 2015-03-05 IMAGING — CR DG FOOT COMPLETE 3+V*L*
1 series · 3 of 3 positions shown · non-contrast
Comparison: 03/21/2013.

CLINICAL DATA: Gangrene.  Altered mental status.

EXAM:
LEFT FOOT - COMPLETE 3+ VIEW

[Series 1: ap · 0.17mm/px · 3 of 3 slices shown]
[im 1/3]
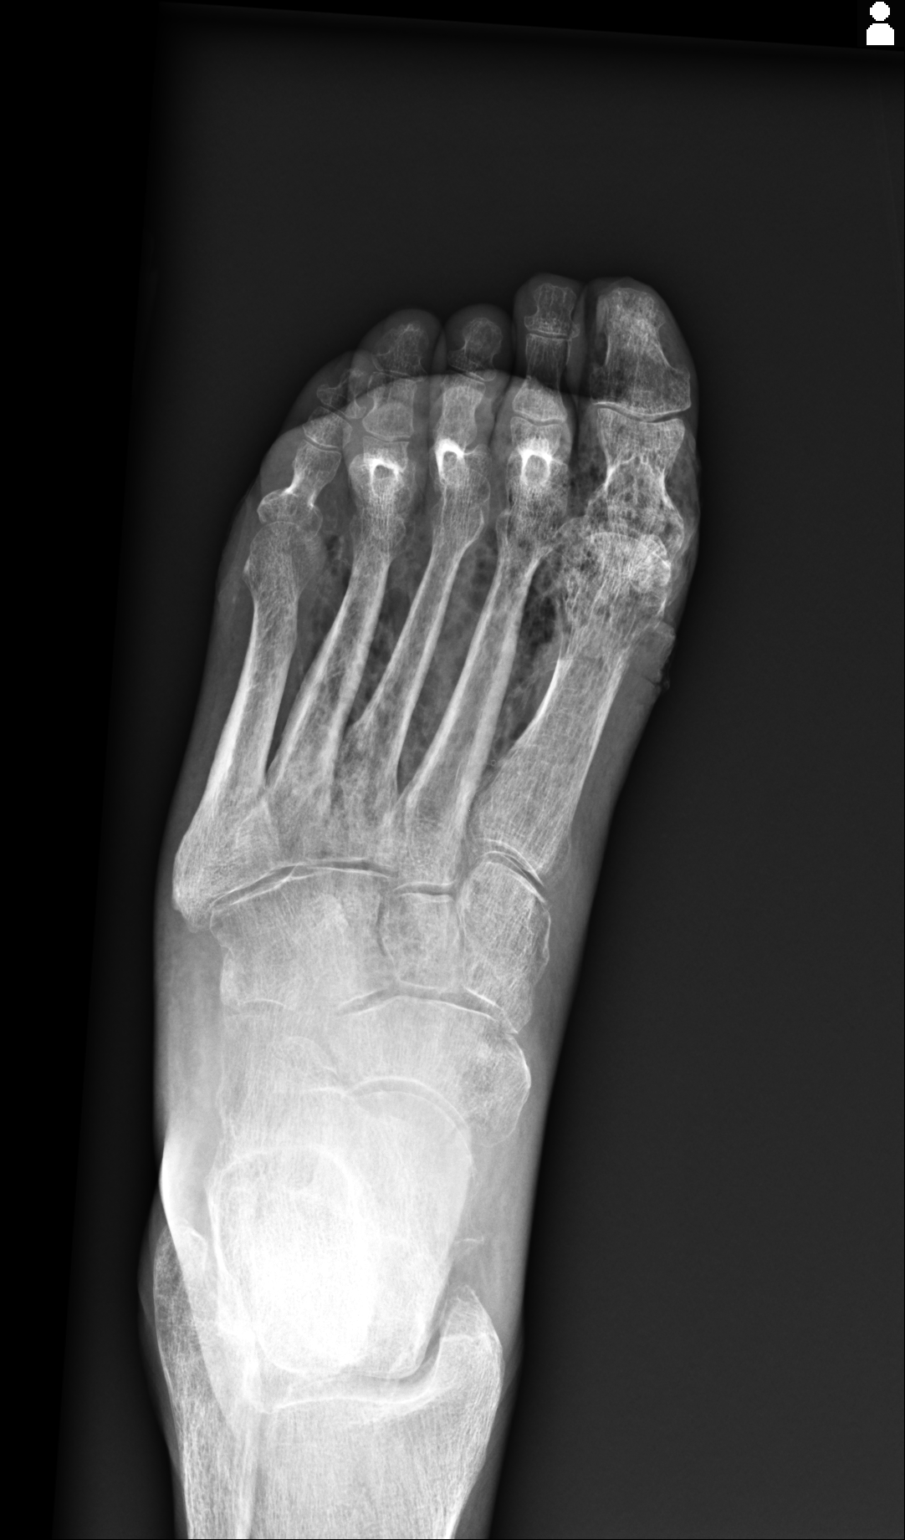
[im 2/3]
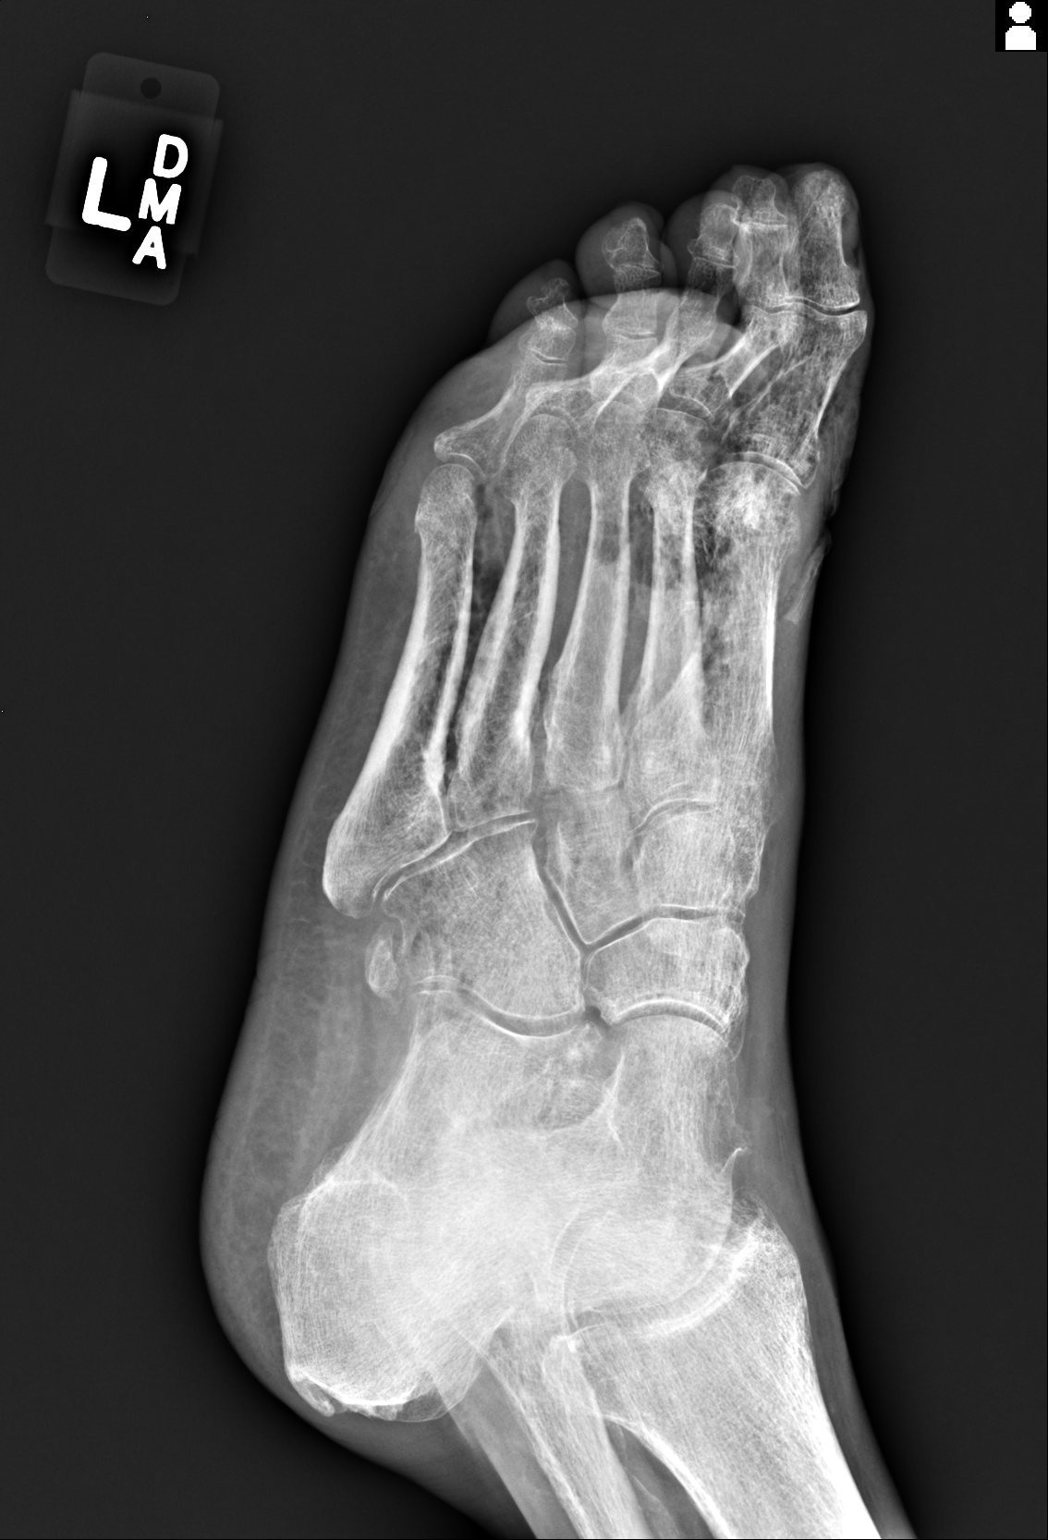
[im 3/3]
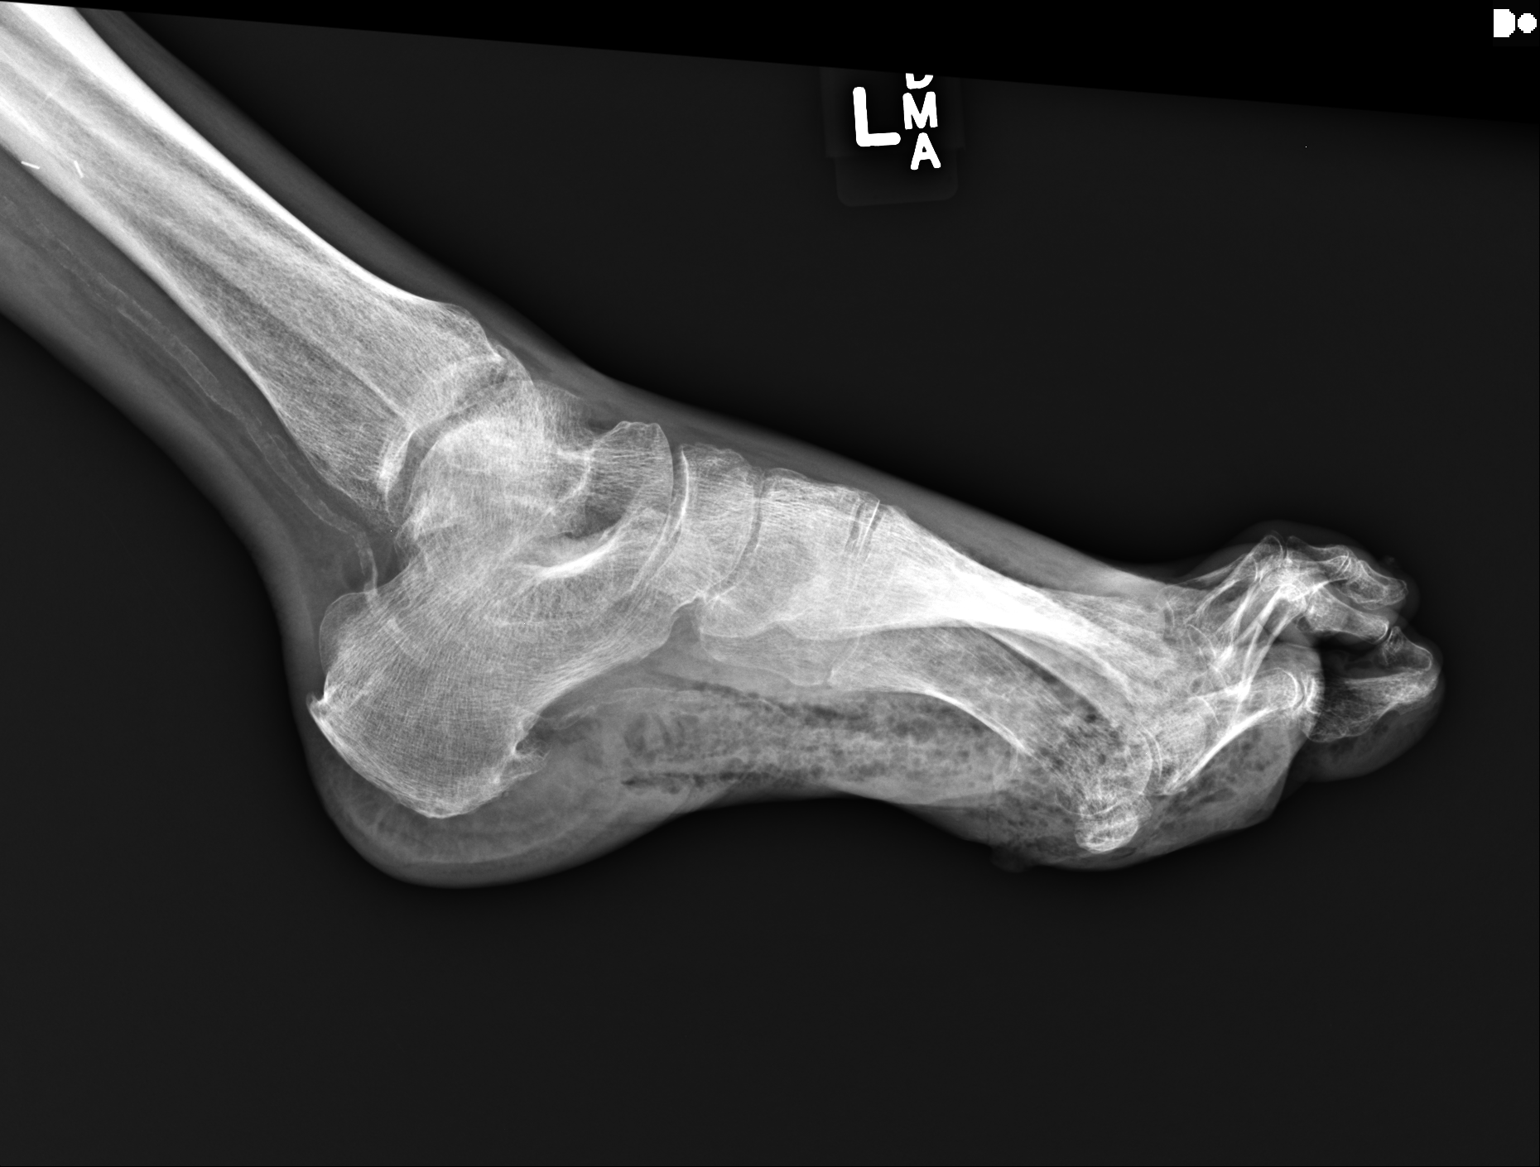

[3 of 3 positions shown; findings below may reference images not displayed]

FINDINGS: The patient has developed extensive soft tissue emphysema throughout
the plantar aspects of the forefoot and midfoot. This soft tissue
air partially obscures the bones which are diffusely demineralized.
No bone destruction, acute fracture or dislocation is identified.
There are scattered vascular calcifications.
IMPRESSION: Diffuse soft tissue emphysema consistent with infection with
gas-forming organism. No radiographic evidence of osteomyelitis.

## 2015-03-13 DEATH — deceased
